# Patient Record
Sex: Male | Born: 2000 | Race: White | Hispanic: No | Marital: Single | Smoking: Never smoker
Health system: Southern US, Community
[De-identification: ages and names within clinical notes are randomized; demographics above are authoritative.]

---
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
## 2022-01-14 ENCOUNTER — Other Ambulatory Visit: Payer: Self-pay

## 2022-01-14 ENCOUNTER — Emergency Department (INDEPENDENT_AMBULATORY_CARE_PROVIDER_SITE_OTHER): Payer: Medicaid Other

## 2022-01-14 ENCOUNTER — Emergency Department (INDEPENDENT_AMBULATORY_CARE_PROVIDER_SITE_OTHER)
Admission: EM | Admit: 2022-01-14 | Discharge: 2022-01-14 | Disposition: A | Discharge status: Home / Self Care | Payer: Medicaid Other | Source: Home / Self Care | Attending: Family Medicine | Admitting: Family Medicine

## 2022-01-14 DIAGNOSIS — S6710XA Crushing injury of unspecified finger(s), initial encounter: Secondary | ICD-10-CM | POA: Diagnosis not present

## 2022-01-14 DIAGNOSIS — S6991XA Unspecified injury of right wrist, hand and finger(s), initial encounter: Secondary | ICD-10-CM | POA: Diagnosis not present

## 2022-01-14 DIAGNOSIS — M7989 Other specified soft tissue disorders: Secondary | ICD-10-CM | POA: Diagnosis not present

## 2022-01-14 NOTE — Discharge Instructions (Signed)
Wear splint as needed for comfort.  You will need to wear this for probably a week or 2 Use ice for 20 minutes every 2-4 hours Elevate to reduce pain and swelling  ibuprofen 600 mg 3 times a day with food  Return as needed

## 2022-01-14 NOTE — ED Triage Notes (Signed)
Pt states that he injured his right index finger. X2 days

## 2022-01-14 NOTE — ED Provider Notes (Addendum)
[facility]    CSN: [id number] Arrival date & time: 01/14/22  1411      History   Chief Complaint Chief Complaint  Patient presents with   Finger Injury    Right index finger injury. X2 days    HPI Joel Foster is a 21 y.o. male.   HPI  Finger got slammed in a car door yesterday.  Here for evaluation.  History reviewed. No pertinent past medical history.  Patient Active Problem List   Diagnosis Date Noted   Left ankle sprain 09/21/2015   Osgood-Schlatter's disease 06/17/2015    History reviewed. No pertinent surgical history.     Home Medications    Prior to Admission medications   Medication Sig Start Date End Date Taking? Authorizing Provider  ADDERALL XR 10 MG 24 hr capsule Take 10 mg by mouth at bedtime. 09/27/20  Yes [provider]  amphetamine-dextroamphetamine (ADDERALL) 5 MG tablet Take 1 tablet by mouth daily. 09/27/20   [provider]    Family History History reviewed. No pertinent family history.  Social History Social History   Tobacco Use   Smoking status: Never   Smokeless tobacco: Never  Substance Use Topics   Alcohol use: Never    Alcohol/week: 0.0 standard drinks   Drug use: Never     Allergies   Penicillins   Review of Systems Review of Systems  See HPI Physical Exam Triage Vital Signs ED Triage Vitals  Enc Vitals Group     BP 01/14/22 1423 (!) 144/70     Pulse Rate 01/14/22 1423 60     Resp 01/14/22 1423 20     Temp 01/14/22 1423 98.2 F (36.8 C)     Temp Source 01/14/22 1423 Oral     SpO2 01/14/22 1423 98 %     Weight 01/14/22 1422 190 lb (86.2 kg)     Height 01/14/22 1422 5\' 9"  (1.753 m)     Head Circumference --      Peak Flow --      Pain Score 01/14/22 1422 6     Pain Loc --      Pain Edu? --      Excl. in GC? --    No data found.  Updated Vital Signs BP (!) 144/70 (BP Location: Left Arm)    Pulse 60    Temp 98.2 F (36.8 C) (Oral)    Resp 20    Ht 5\' 9"  (1.753 m)    Wt  86.2 kg    SpO2 98%    BMI 28.06 kg/m   Physical Exam Constitutional:      General: He is not in acute distress.    Appearance: He is well-developed.  HENT:     Head: Normocephalic and atraumatic.  Eyes:     Conjunctiva/sclera: Conjunctivae normal.     Pupils: Pupils are equal, round, and reactive to light.  Cardiovascular:     Rate and Rhythm: Normal rate.  Pulmonary:     Effort: Pulmonary effort is normal. No respiratory distress.  Abdominal:     General: There is no distension.     Palpations: Abdomen is soft.  Musculoskeletal:        General: Normal range of motion.     Cervical back: Normal range of motion.     Comments: Index finger of the right hand shows purple discoloration and swelling around the proximal phalanx with some tenderness of the adjacent joints MCP and PIP.  Limited range of  motion.  No palpable bony defect.  Skin:    General: Skin is warm and dry.  Neurological:     Mental Status: He is alert.  Psychiatric:        Mood and Affect: Mood normal.        Behavior: Behavior normal.     UC Treatments / Results  Labs (all labs ordered are listed, but only abnormal results are displayed) Labs Reviewed - No data to display  EKG   Radiology DG Finger Index Right  Result Date: 01/14/2022 CLINICAL DATA:  Index finger injury, slammed in door. Entire digit swelling and pain. EXAM: RIGHT INDEX FINGER 2+V COMPARISON:  None. FINDINGS: Subtle cortical irregularity along the volar aspect of the index finger distal phalanx shaft, seen on lateral view only. Diffuse swelling throughout the index finger. No subcutaneous emphysema. Joint spaces are intact. IMPRESSION: Subtle cortical irregularity along the volar aspect of the index finger distal phalanx shaft may represent an incomplete fracture. No dislocation. Electronically Signed   By: Joel Foster M.D.   On: 01/14/2022 14:56   Based on clinical examination, Area identified is not a fracture Procedures Procedures  (including critical care time)  Medications Ordered in UC Medications - No data to display  Initial Impression / Assessment and Plan / UC Course  I have reviewed the triage vital signs and the nursing notes.  Pertinent labs & imaging results that were available during my care of the patient were reviewed by me and considered in my medical decision making (see chart for details).     Discussed with patient no fracture.  Ice.  Elevate.  Wear brace as needed for comfort.  Return as needed Final Clinical Impressions(s) / UC Diagnoses   Final diagnoses:  Crushing injury of finger, initial encounter     Discharge Instructions      Wear splint as needed for comfort.  You will need to wear this for probably a week or 2 Use ice for 20 minutes every 2-4 hours Elevate to reduce pain and swelling  ibuprofen 600 mg 3 times a day with food  Return as needed     ED Prescriptions   None    PDMP not reviewed this encounter.   Eustace Moore, MD 01/14/22 2800    Eustace Moore, MD 01/14/22 1515

## 2023-03-28 ENCOUNTER — Encounter: Payer: Self-pay | Admitting: Physician Assistant

## 2023-03-28 ENCOUNTER — Telehealth (INDEPENDENT_AMBULATORY_CARE_PROVIDER_SITE_OTHER): Payer: Self-pay | Admitting: Physician Assistant

## 2023-03-28 DIAGNOSIS — F4310 Post-traumatic stress disorder, unspecified: Secondary | ICD-10-CM | POA: Insufficient documentation

## 2023-03-28 DIAGNOSIS — G479 Sleep disorder, unspecified: Secondary | ICD-10-CM

## 2023-03-28 DIAGNOSIS — F4323 Adjustment disorder with mixed anxiety and depressed mood: Secondary | ICD-10-CM | POA: Insufficient documentation

## 2023-03-28 DIAGNOSIS — F4321 Adjustment disorder with depressed mood: Secondary | ICD-10-CM

## 2023-03-28 MED ORDER — HYDROXYZINE PAMOATE 25 MG PO CAPS: ORAL_CAPSULE | ORAL | 0 refills | Status: AC

## 2023-03-28 NOTE — Progress Notes (Signed)
..  Virtual Visit via Telephone Note  I connected with Joel Foster on 03/28/23 at 11:50 AM EDT by telephone and verified that I am speaking with the correct person using two identifiers.  Location: Patient: home Provider: clinic  .Marland KitchenParticipating in visit:  Patient: Joel Foster Provider: Tandy Gaw PA-C   I discussed the limitations, risks, security and privacy concerns of performing an evaluation and management service by telephone and the availability of in person appointments. I also discussed with the patient that there may be a patient responsible charge related to this service. The patient expressed understanding and agreed to proceed.   History of Present Illness: Pt is a 22 yo male who found his mom dead after she hung herself. He can't get the image out of his mind. He is anxious and scared and overwhelmed. He denies any thoughts of hurting himself or others. He is wanting to talk with someone soon. He is having trouble sleeping. He is crying a lot.   .. 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 -     Ambulatory referral to Psychology  Adjustment disorder with mixed anxiety and depressed mood -     hydrOXYzine  (VISTARIL) 25 MG capsule; Take 1-2 tablets as needed every 6-8 hours for anxiety and/or sleep. -     Ambulatory referral to Psychology  PTSD (post-traumatic stress disorder) -     hydrOXYzine (VISTARIL) 25 MG capsule; Take 1-2 tablets as needed every 6-8 hours for anxiety and/or sleep. -     Ambulatory referral to Psychology  Trouble in sleeping   Pt denies any thoughts of self harm He does have his fathers and brothers for support during this hard time Urgent counseling referral placed Hydroxyzine given to use as needed for anxiety and sleep  Follow Up Instructions:    I discussed the assessment and treatment plan with the patient. The patient was provided an opportunity to ask questions and all were answered. The patient agreed with the plan and demonstrated an understanding of the instructions.   The patient was advised to call back or seek an in-person evaluation if the symptoms worsen or if the condition fails to improve as anticipated.  I provided 10 minutes of non-face-to-face time during this encounter.   Tandy Gaw, PA-C

## 2023-04-09 ENCOUNTER — Ambulatory Visit: Payer: Self-pay | Admitting: Behavioral Health

## 2023-04-10 ENCOUNTER — Encounter: Payer: Self-pay | Admitting: Behavioral Health

## 2023-04-10 ENCOUNTER — Ambulatory Visit (INDEPENDENT_AMBULATORY_CARE_PROVIDER_SITE_OTHER): Payer: Self-pay | Admitting: Behavioral Health

## 2023-04-10 DIAGNOSIS — F322 Major depressive disorder, single episode, severe without psychotic features: Secondary | ICD-10-CM

## 2023-04-10 DIAGNOSIS — F411 Generalized anxiety disorder: Secondary | ICD-10-CM

## 2023-04-10 DIAGNOSIS — F4321 Adjustment disorder with depressed mood: Secondary | ICD-10-CM

## 2023-04-10 NOTE — Progress Notes (Signed)
[facility] Counsel[facility] Initial Adult Exam  Name: Joel Foster Date: 04/10/2023 MRN: [id number] DOB: [birth year] PCP: System, Provider Not In  Time spent: 60 minutes  Guardian/Payee: Self  Paperwork requested: No   Reason for Visit /Presenting Problem: Grief  Joel Foster is a 22 year old male who currently lives with his biological father as well as biological brother is Joel Foster who is 9 and Joel Foster who is 14.  His biological father goes by Joel Foster but whose name is Joel Foster.  He currently works part-time for [employer] 430 or 5:00 in the morning until either 8 in the morning and up to noon depending on how busy they are.  It he has Saturdays and 1 day during the week off typically.  He reports a close relationship with both brothers and his father.  He presents with symptoms of grief over his biological mother's death.  Joel Foster, his mother committed suicide by hanging herself on April 26, on Friday.  The patient is the one that found her in the garage.  He said that she had a routine of coming in from work and was usually on the phone and will go into her room to change clothes before coming back out.  About the time she came home and his job was to pick up his younger brother from school and on that day he was getting ready to go when his mom came home.  She was on the phone and held her finger up as if to say wait just a minute so the patient did that she went into her bedroom.  Gave her a few minutes but did not do he needed to go get his brother.  He went out to the car and realized that he had forgotten his car keys and moved back in to get them.  He heard his mother going to the garage and knew that she was talking to his father and that they were arguing.  He went back in and waited another couple of minutes before leaving but his mother did not come out of the garage so he left to go get his brother from school.  He said that none of this was necessarily unusual in that his parents argue  regularly but they typically made up pretty quickly.  He said it took his mom longer to get over it and his dad and often times the patient was his mom's sounding board.  He said he did not help with his mom but told her in the past that he did not want to get him between that with his parents.  On that particular day when he got home from school his father called asking him to check on his mom.  His brother cashes stayed in the kitchen eating and the patient went into his parent's bedroom initially not finding his mom.  He then went into the garage where his mom and dad often would sit and talk and did not initially see them where they sit but then look to a different part of the garage and saw her saying she was "almost standing up.  He said it frightened him what he saw because it did not look like his mother.  He did not go into detail other than to say there is still some fear there and he thinks about it.  He has not yet been able to go back into the garage.  His initial response was to scream and go back into the house because he was still  on the phone with his father.  He remembers saying she is dead but also remembers trying to protect his brother and his father because his father was driving home from work from [employer].  He was able to call 911 but did not let his brother go into the garage.  He kept trying to not tell his father that his mother was dead although he knew instantly that she was.  He said he ran out into the driveway and fell on his knees sobbing and then the police showed up and his father showed up.  He said initially he felt guilt because he did not stay and let his mom talk to him but he knows that he was always available to her and that he had to go get his brother from school and did not notice anything unusual about her behavior that day or any of the days prior to that.  To his knowledge that she did not leave any notes or communication.  He did not notice any changes in her  behavior or any more intensity his parents arguing.  He said if anything over the past few months they have gotten closer and talked more often to the point of staying up late and over sleeping and not getting his younger brother to school on time.  He and his father as well as his brother have talked about a very openly.  His mother and father met when they were 15 or 16 became pregnant with him around 48 and got married.  He said even though they argued he felt he had a good marriage and had just gotten stronger.  His mother was a hairdresser and had just moved to shop a few months ago that she really seemed to enjoy it.  He was very close to his mother as were his siblings.  He also has a very good relationship with his father.  The patient is still trying to understand what happened and talked about the stages of grief and how he could experience some of them simultaneously.  He did complete the PHQ-9 and GAD-7 score both of which showed severe depression and anxiety.  He has been tearful.  He has had difficulty sleeping.  The says he is not eating as well as he has in the past.  About a year ago he weighed 300 pounds and lost significant weight and started working out and is now down to 185 which is solid workout way for him.  He was eating healthier but has little desire to eat healthy now.  He has not been able to get back to the gym citing low motivation and energy which I validated as normal.  We acknowledge that this is complicated grief and we talked about what that looks like to him.  He does contract for safety saying he is not having any self-harm thoughts or thoughts of hurting himself.  We talked about some of the things that he enjoyed about his mom and how they were similar to what they enjoyed doing together.  He cannot bring himself to go into the garage yet and we talked about not feeling like he has to do so.  We talked about different ways that he could cope with grief including time with  family, working out, time with friends.  I will meet with him weekly video and call him for any an office visit if I have a cancellation.  Mental Status Exam: Appearance:   Casual and Well  Groomed     Behavior:  Appropriate  Motor:  Normal  Speech/Language:   Normal Rate  Affect:  Appropriate  Mood:  normal  Thought process:  normal  Thought content:    WNL  Sensory/Perceptual disturbances:    WNL  Orientation:  oriented to person, place, time/date, situation, day of week, month of year, and year  Attention:  Good  Concentration:  Good  Memory:  WNL  Fund of knowledge:   Good  Insight:    Good  Judgment:   Good  Impulse Control:  Good     Reported Symptoms: Grief, anxiety, depression  Risk Assessment: Danger to Self:  No Self-injurious Behavior: No Danger to Others: No Duty to Warn:no Physical Aggression / Violence:No  Access to Firearms a concern: No  Gang Involvement:No  Patient / guardian was educated about steps to take if suicide or homicide risk level increases between visits: yes While future psychiatric events cannot be accurately predicted, the patient does not currently require acute inpatient psychiatric care and does not currently meet [facility] involuntary commitment criteria.  Substance Abuse History: Current substance abuse: No     Past Psychiatric History:   No previous psychological problems have been observed Outpatient Providers: Primary care physician History of Psych Hospitalization:  None reported Psychological Testing:  n/a    Abuse History:  Victim of: No.,  None reported    Report needed: No. Victim of Neglect:No. Perpetrator of  none reported   Witness / Exposure to Domestic Violence: Verbal arguing between parents  Protective Services Involvement: No  Witness to [employer] Violence:  No   Family History: No family history on file.  Living situation: the patient lives with their family  Sexual Orientation:  Did not  discuss  Relationship Status: single  Name of spouse / other: If a parent, number of children / ages:  Support Systems: friends Father, brothers, aunt  Surveyor, quantity Stress:  No   Income/Employment/Disability: Employment  Financial planner: No   Educational History: Education: high school diploma/GED  Religion/Sprituality/World View: Did not discuss  Any cultural differences that may affect / interfere with treatment:  not applicable   Recreation/Hobbies: Working out, Adult nurse games, time with brothers and father  Stressors: Loss of biological mother    Strengths: Supportive Relationships, Family, Friends, and Able to Communicate Effectively  Barriers:     Legal History: Pending legal issue / charges: The patient has no significant history of legal issues. History of legal issue / charges:  Not applicable  Medical History/Surgical History: reviewed No past medical history on file.  No past surgical history on file.  Medications: Current Outpatient Medications  Medication Sig Dispense Refill   ADDERALL XR 10 MG 24 hr capsule Take 10 mg by mouth at bedtime.     amphetamine-dextroamphetamine (ADDERALL) 5 MG tablet Take 1 tablet by mouth daily.     hydrOXYzine (VISTARIL) 25 MG capsule Take 1-2 tablets as needed every 6-8 hours for anxiety and/or sleep. 30 capsule 0   No current facility-administered medications for this visit.    Allergies  Allergen Reactions   Penicillins     Diagnoses:  Complicated grief  Plan of Care: I will meet with the patient weekly   French Ana, [facility]

## 2023-04-10 NOTE — Progress Notes (Signed)
                Daruis Swaim M Vollie Aaron, LCMHC 

## 2023-04-18 ENCOUNTER — Ambulatory Visit (INDEPENDENT_AMBULATORY_CARE_PROVIDER_SITE_OTHER): Payer: Self-pay | Admitting: Behavioral Health

## 2023-04-18 ENCOUNTER — Encounter: Payer: Self-pay | Admitting: Behavioral Health

## 2023-04-18 DIAGNOSIS — F4321 Adjustment disorder with depressed mood: Secondary | ICD-10-CM

## 2023-04-18 NOTE — Progress Notes (Signed)
[facility] Counselor/Therapist Progress Note  Patient ID: Joel Foster, MRN: [id number],    Date: 04/18/2023  Time Spent: 47 minutes  Treatment Type: Individual Therapy  Reported Symptoms: Grief, anxiety, depression  Mental Status Exam: Appearance:  Well Groomed     Behavior: Appropriate  Motor: Normal  Speech/Language:  Clear and Coherent  Affect: Flat  Mood: normal  Thought process: normal  Thought content:   WNL  Sensory/Perceptual disturbances:   WNL  Orientation: oriented to person, place, time/date, situation, day of week, and month of year  Attention: Good  Concentration: Good  Memory: WNL  Fund of knowledge:  Good  Insight:   Good  Judgment:  Good  Impulse Control: Good   Risk Assessment: Danger to Self:  No Self-injurious Behavior: No Danger to Others: No Duty to Warn:no Physical Aggression / Violence:No  Access to Firearms a concern: No  Gang Involvement:No   Subjective: The patient indicated that he has gone back to working out and even though it does not feel the same he knows that is good for him.  He starts back to work tomorrow and is thankful that one of his best friends works there with him.  He does have some anxiety about going back to work as it has been over a month.  I encouraged him to set boundaries as he is comfortable in not having to answer questions at work if he does not want to do that about what happened.  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  He and his younger brother whom he is very close to have had some good open conversation about his mother's death but says that he is close to both of his brothers as well as his father and they are talking pretty openly.  He acknowledges some anger and we talked about anger being a natural reaction based on what happened in some ways to cope with that anger in a healthy way.  Working out  and going back to work or 2 of those.  He understands that there are some things that he will never understand.  We talked about what it was about his mother's and his relationship that was so special.  He is still texting her and we talked about having communication with her and how that feels healthy to him.  There have been a couple of stretches for hours at a time in which he did not think about it and he expresses some guilt with that.  We talked about grief being a process and that his brain and body need a break from thinking and feeling about the situation and it is okay to have those stretches as a part of the healing process.  I encouraged continued communication with his family about what happened.  His father's sister has been coming over regularly and he has a lot of respect and care for her.  He does contract for safety having no thoughts of hurting himself or anyone else.  Interventions: Cognitive Behavioral Therapy  Diagnosis: Complicated grief  Plan: I will meet with the patient weekly.  Treatment plan: To use cognitive behavioral, dialectical behavior therapy and grief support therapy to help reduce the patient's grief by 25 or 30% by October 27, 2023.  Goals will be to have a healthier response to the loss of his mom and less sadness as evidenced  by patient report.  We will facilitate the grieving process over the death of his mom with the goal of helping him feel less overwhelmed with the suddenness and the tragic nature of her death.  We will encourage the telling of his grief story about his mom while identifying and educating him on the different stages of grief.  I will encourage him to bring pictures and memorabilia and tell about his mom to help process his feelings.  We will also help him process feelings of guilt and blame associated with her loss.  French Ana, [facility]

## 2023-04-18 NOTE — Progress Notes (Signed)
                Yuriy Cui M Dannica Bickham, LCMHC 

## 2023-04-19 ENCOUNTER — Ambulatory Visit: Payer: Self-pay | Admitting: Behavioral Health

## 2023-04-25 ENCOUNTER — Ambulatory Visit: Payer: Self-pay | Admitting: Behavioral Health
